# Patient Record
Sex: Female | Born: 1960 | Race: White | Hispanic: No | Marital: Single | Smoking: Never smoker
Health system: Southern US, Community
[De-identification: ages and names within clinical notes are randomized; demographics above are authoritative.]

---

## 1997-12-30 ENCOUNTER — Other Ambulatory Visit: Admission: RE | Admit: 1997-12-30 | Discharge: 1997-12-30 | Payer: Self-pay | Admitting: Obstetrics and Gynecology

## 1999-03-24 ENCOUNTER — Other Ambulatory Visit: Admission: RE | Admit: 1999-03-24 | Discharge: 1999-03-24 | Payer: Self-pay | Admitting: Obstetrics and Gynecology

## 2000-04-19 ENCOUNTER — Encounter: Payer: Self-pay | Admitting: Obstetrics and Gynecology

## 2000-04-19 ENCOUNTER — Ambulatory Visit (HOSPITAL_COMMUNITY): Admission: RE | Admit: 2000-04-19 | Discharge: 2000-04-19 | Payer: Self-pay | Admitting: Obstetrics and Gynecology

## 2000-04-28 ENCOUNTER — Encounter: Payer: Self-pay | Admitting: Obstetrics and Gynecology

## 2000-04-28 ENCOUNTER — Encounter: Admission: RE | Admit: 2000-04-28 | Discharge: 2000-04-28 | Payer: Self-pay | Admitting: Obstetrics and Gynecology

## 2000-06-01 ENCOUNTER — Other Ambulatory Visit: Admission: RE | Admit: 2000-06-01 | Discharge: 2000-06-01 | Payer: Self-pay | Admitting: Obstetrics and Gynecology

## 2000-11-23 ENCOUNTER — Encounter: Payer: Self-pay | Admitting: Obstetrics and Gynecology

## 2000-11-23 ENCOUNTER — Encounter: Admission: RE | Admit: 2000-11-23 | Discharge: 2000-11-23 | Payer: Self-pay | Admitting: Obstetrics and Gynecology

## 2001-08-30 ENCOUNTER — Ambulatory Visit (HOSPITAL_COMMUNITY): Admission: RE | Admit: 2001-08-30 | Discharge: 2001-08-30 | Payer: Self-pay | Admitting: Obstetrics and Gynecology

## 2001-08-30 ENCOUNTER — Encounter: Payer: Self-pay | Admitting: Obstetrics and Gynecology

## 2001-09-05 ENCOUNTER — Other Ambulatory Visit: Admission: RE | Admit: 2001-09-05 | Discharge: 2001-09-05 | Payer: Self-pay | Admitting: Obstetrics and Gynecology

## 2001-12-05 ENCOUNTER — Other Ambulatory Visit: Admission: RE | Admit: 2001-12-05 | Discharge: 2001-12-05 | Payer: Self-pay | Admitting: Obstetrics and Gynecology

## 2002-04-18 ENCOUNTER — Other Ambulatory Visit: Admission: RE | Admit: 2002-04-18 | Discharge: 2002-04-18 | Payer: Self-pay

## 2002-09-25 ENCOUNTER — Ambulatory Visit (HOSPITAL_COMMUNITY): Admission: RE | Admit: 2002-09-25 | Discharge: 2002-09-25 | Payer: Self-pay | Admitting: Obstetrics and Gynecology

## 2002-09-25 ENCOUNTER — Encounter: Payer: Self-pay | Admitting: Obstetrics and Gynecology

## 2003-10-15 ENCOUNTER — Ambulatory Visit (HOSPITAL_COMMUNITY): Admission: RE | Admit: 2003-10-15 | Discharge: 2003-10-15 | Payer: Self-pay | Admitting: Obstetrics and Gynecology

## 2004-06-10 ENCOUNTER — Inpatient Hospital Stay (HOSPITAL_COMMUNITY): Admission: EM | Admit: 2004-06-10 | Discharge: 2004-06-14 | Payer: Self-pay | Admitting: Emergency Medicine

## 2004-11-02 ENCOUNTER — Encounter: Payer: Self-pay | Admitting: Obstetrics and Gynecology

## 2004-11-02 ENCOUNTER — Ambulatory Visit (HOSPITAL_COMMUNITY): Admission: RE | Admit: 2004-11-02 | Discharge: 2004-11-02 | Payer: Self-pay | Admitting: Obstetrics and Gynecology

## 2005-12-02 IMAGING — CR DG CHEST 2V
2 series · 2 of 2 positions shown · non-contrast
Comparison: none

CLINICAL DATA: Cough, fever, weakness.  Headache.
CHEST - 2 VIEW:
No prior exams available for comparison.
Mild cardiac prominence.
Mediastinal contours normal.
Extensive bilateral infiltrates in the mid and lower lungs bilaterally, likely representing pneumonia.  
No definite effusion or pneumothorax.  
Bones unremarkable.

[view not recorded (1 of 2)]
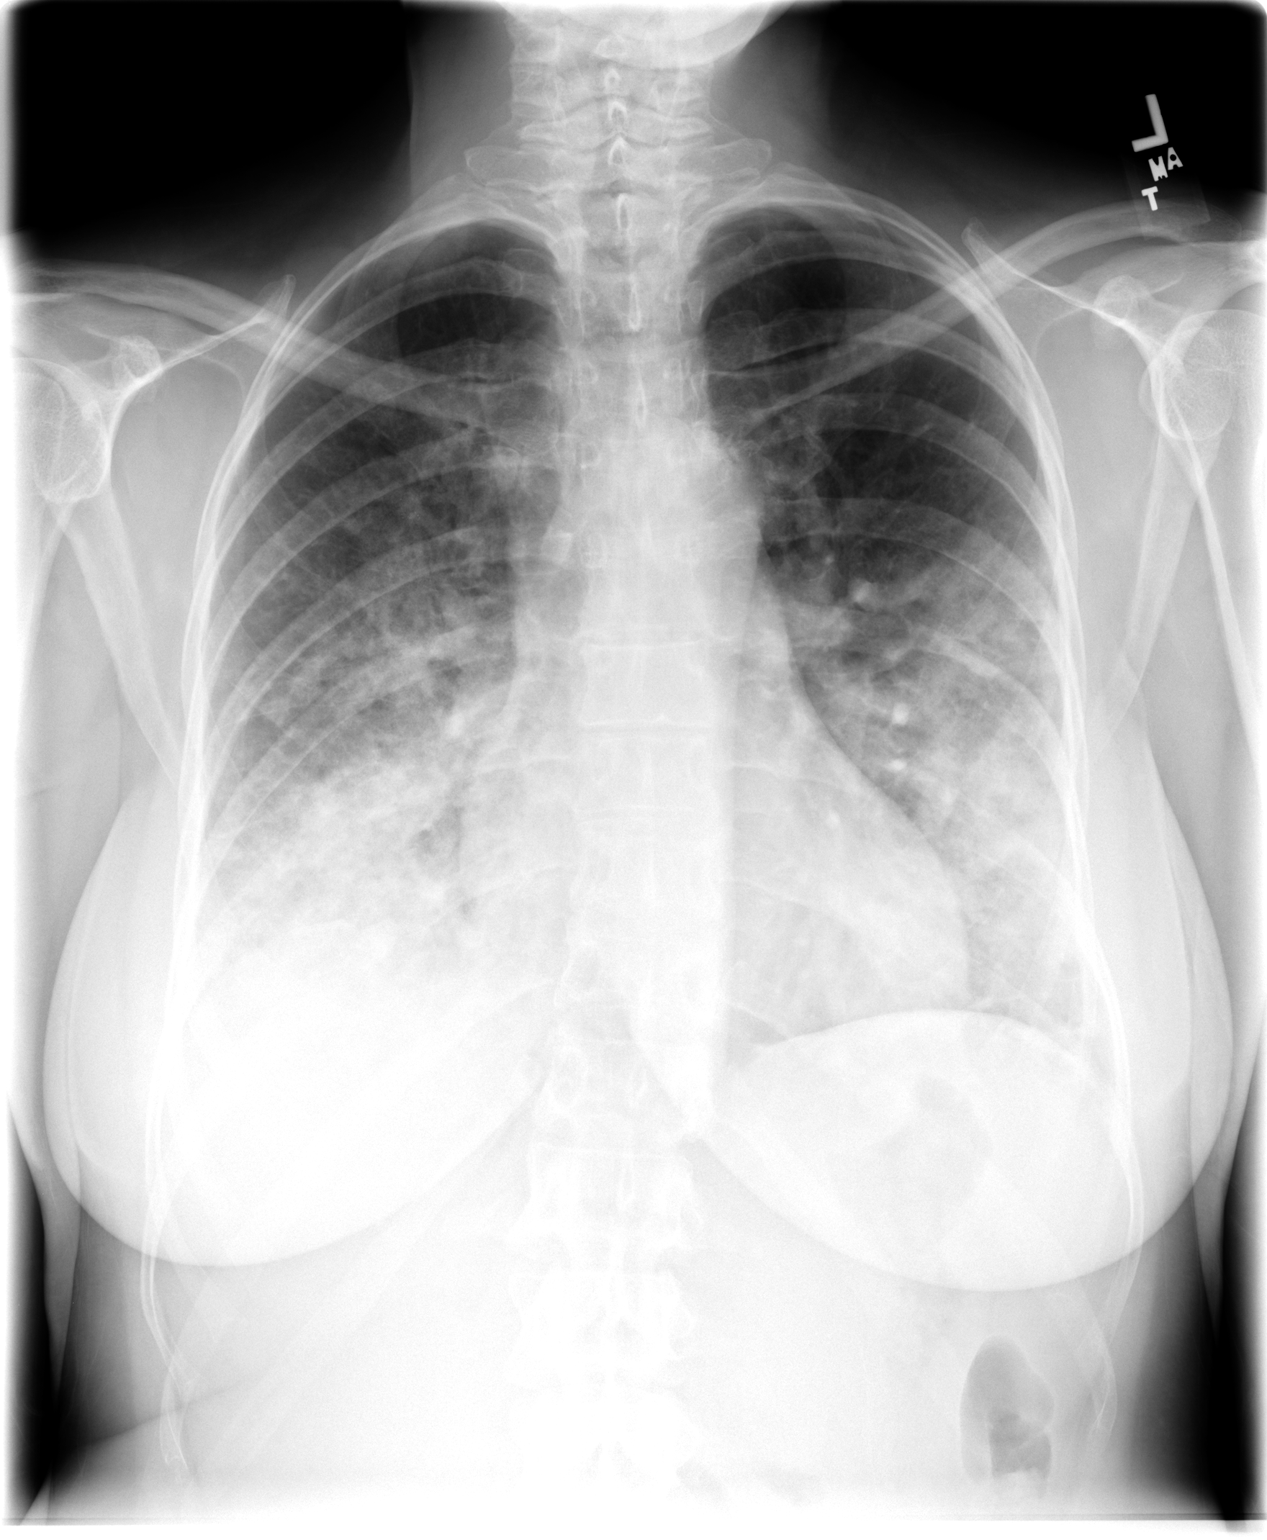

[view not recorded (2 of 2)]
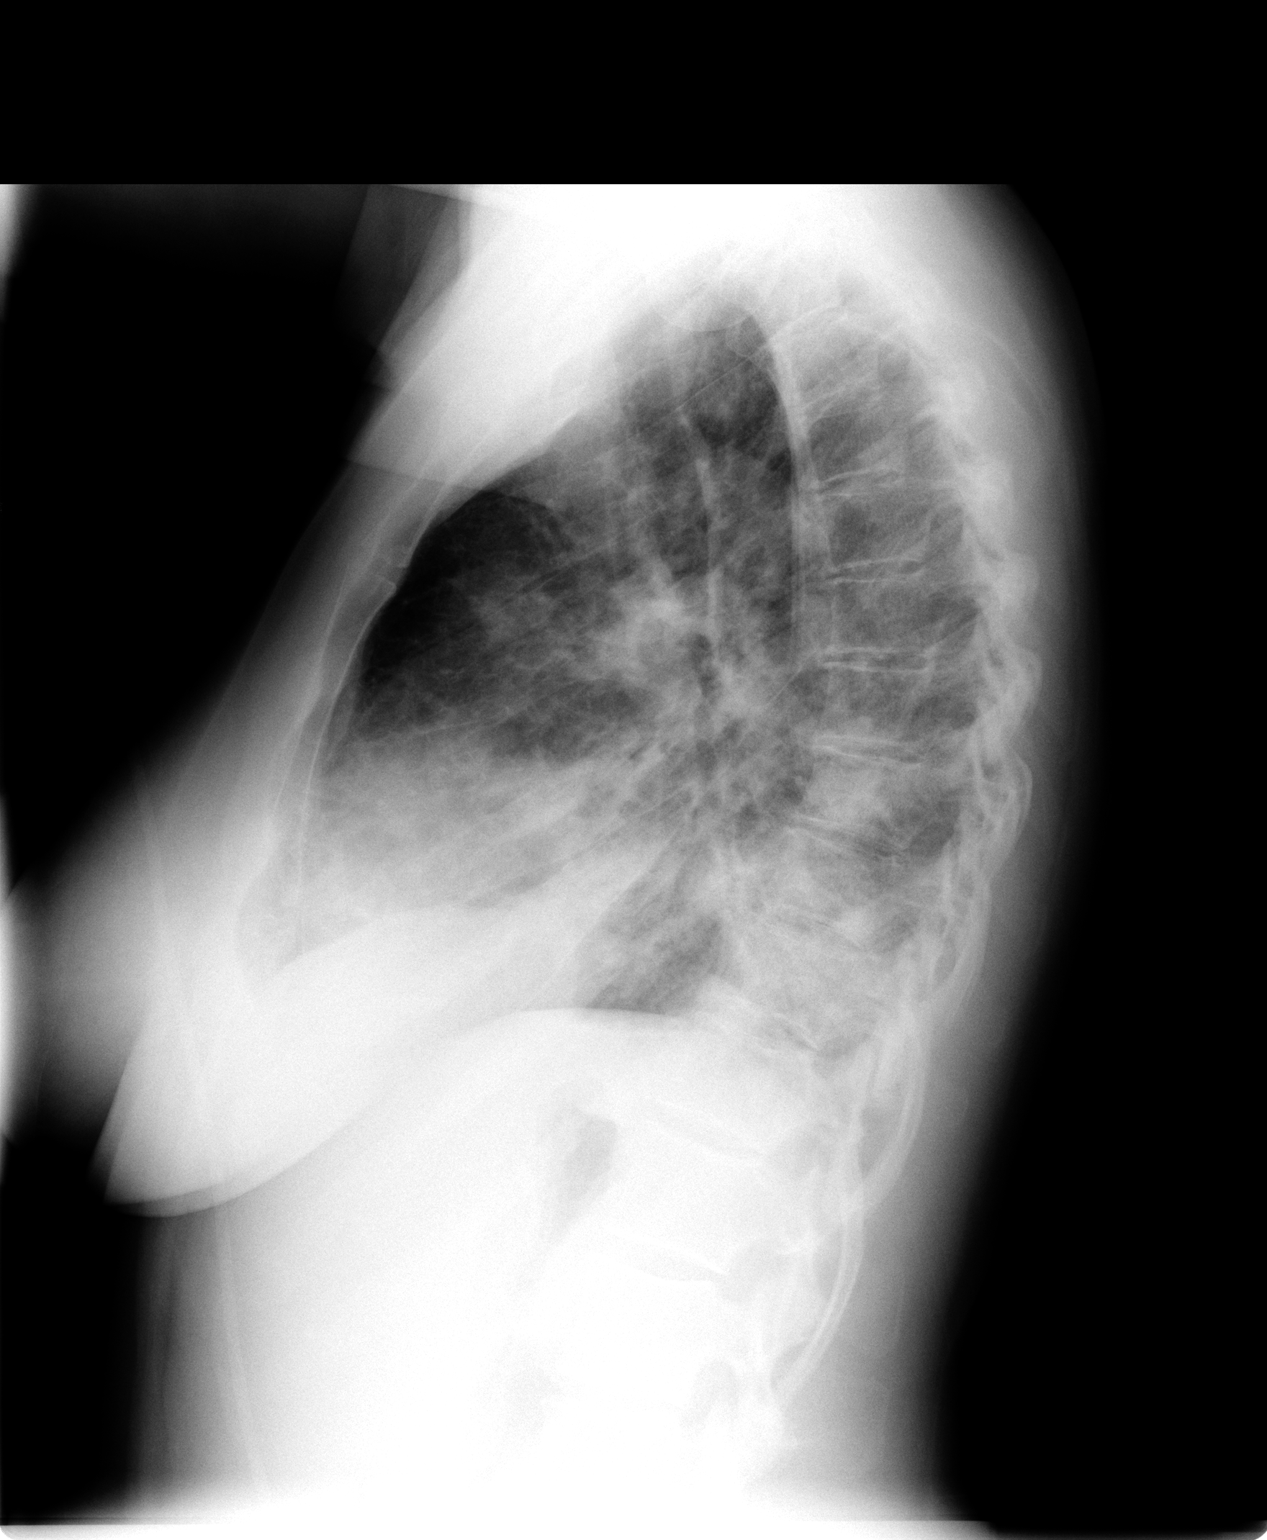

[2 of 2 positions shown; findings below may reference images not displayed]

IMPRESSION: Extensive bibasilar pulmonary infiltrates consistent with pneumonia.

## 2006-02-16 ENCOUNTER — Ambulatory Visit (HOSPITAL_COMMUNITY): Admission: RE | Admit: 2006-02-16 | Discharge: 2006-02-16 | Payer: Self-pay | Admitting: Obstetrics and Gynecology

## 2006-03-01 ENCOUNTER — Encounter: Admission: RE | Admit: 2006-03-01 | Discharge: 2006-03-01 | Payer: Self-pay | Admitting: Obstetrics and Gynecology

## 2010-08-15 ENCOUNTER — Encounter: Payer: Self-pay | Admitting: Obstetrics and Gynecology

## 2011-10-20 ENCOUNTER — Other Ambulatory Visit (HOSPITAL_COMMUNITY): Payer: Self-pay | Admitting: Obstetrics and Gynecology

## 2011-10-20 DIAGNOSIS — Z1231 Encounter for screening mammogram for malignant neoplasm of breast: Secondary | ICD-10-CM

## 2011-10-21 ENCOUNTER — Other Ambulatory Visit: Payer: Self-pay | Admitting: Obstetrics and Gynecology

## 2011-10-21 DIAGNOSIS — Z1231 Encounter for screening mammogram for malignant neoplasm of breast: Secondary | ICD-10-CM

## 2011-10-21 DIAGNOSIS — R921 Mammographic calcification found on diagnostic imaging of breast: Secondary | ICD-10-CM

## 2011-11-01 ENCOUNTER — Ambulatory Visit
Admission: RE | Admit: 2011-11-01 | Discharge: 2011-11-01 | Disposition: A | Discharge status: Home / Self Care | Payer: BC Managed Care – PPO | Source: Ambulatory Visit | Attending: Obstetrics and Gynecology | Admitting: Obstetrics and Gynecology

## 2011-11-01 DIAGNOSIS — R921 Mammographic calcification found on diagnostic imaging of breast: Secondary | ICD-10-CM

## 2011-11-17 ENCOUNTER — Ambulatory Visit (HOSPITAL_COMMUNITY): Payer: Self-pay

## 2012-11-29 ENCOUNTER — Other Ambulatory Visit: Payer: Self-pay

## 2012-11-29 DIAGNOSIS — Z1231 Encounter for screening mammogram for malignant neoplasm of breast: Secondary | ICD-10-CM

## 2013-01-02 ENCOUNTER — Ambulatory Visit
Admission: RE | Admit: 2013-01-02 | Discharge: 2013-01-02 | Disposition: A | Discharge status: Home / Self Care | Payer: BC Managed Care – PPO | Source: Ambulatory Visit

## 2013-01-02 DIAGNOSIS — Z1231 Encounter for screening mammogram for malignant neoplasm of breast: Secondary | ICD-10-CM

## 2014-12-23 ENCOUNTER — Encounter (HOSPITAL_COMMUNITY): Payer: Self-pay | Admitting: Emergency Medicine

## 2014-12-23 ENCOUNTER — Emergency Department (HOSPITAL_COMMUNITY)
Admission: EM | Admit: 2014-12-23 | Discharge: 2014-12-23 | Disposition: A | Discharge status: Home / Self Care | Payer: BLUE CROSS/BLUE SHIELD | Attending: Emergency Medicine | Admitting: Emergency Medicine

## 2014-12-23 DIAGNOSIS — Z79899 Other long term (current) drug therapy: Secondary | ICD-10-CM | POA: Insufficient documentation

## 2014-12-23 DIAGNOSIS — Z880 Allergy status to penicillin: Secondary | ICD-10-CM | POA: Insufficient documentation

## 2014-12-23 DIAGNOSIS — L259 Unspecified contact dermatitis, unspecified cause: Secondary | ICD-10-CM

## 2014-12-23 DIAGNOSIS — R21 Rash and other nonspecific skin eruption: Secondary | ICD-10-CM | POA: Diagnosis present

## 2014-12-23 MED ORDER — HYDROXYZINE HCL 25 MG PO TABS: 25.0000 mg | ORAL_TABLET | Freq: Four times a day (QID) | ORAL | Status: DC | PRN

## 2014-12-23 MED ORDER — PREDNISONE 50 MG PO TABS
60.0000 mg | ORAL_TABLET | Freq: Once | ORAL | Status: AC
  Administered 2014-12-23: 60 mg via ORAL
  Filled 2014-12-23 (×2): qty 1

## 2014-12-23 MED ORDER — PREDNISONE 10 MG PO TABS: 60.0000 mg | ORAL_TABLET | Freq: Every day | ORAL | Status: DC

## 2014-12-23 NOTE — ED Provider Notes (Signed)
TIME SEEN: 4:55 AM  CHIEF COMPLAINT: Rash  HPI: Pt is a 54 y.o. female who presents to the emergency department with diffuse pruritic rash that started yesterday. States that she thinks it is from a new laundry detergent. No lip or tongue swelling. No difficulty talking, swallowing, breathing. No wheezing. States she has been taking Benadryl at home with some relief. No other new soaps, lotions, medications, foods or animal exposures. Denies any tick bite. No fever. No rash on her palms or soles.  ROS: See HPI Constitutional: no fever  Eyes: no drainage  ENT: no runny nose   Cardiovascular:  no chest pain  Resp: no SOB  GI: no vomiting GU: no dysuria Integumentary: no rash  Allergy: no hives  Musculoskeletal: no leg swelling  Neurological: no slurred speech ROS otherwise negative  PAST MEDICAL HISTORY/PAST SURGICAL HISTORY:  History reviewed. No pertinent past medical history.  MEDICATIONS:  Prior to Admission medications   Medication Sig Start Date End Date Taking? Authorizing Provider  Multiple Vitamin (MULTIVITAMIN) tablet Take 1 tablet by mouth daily.   Yes Historical Provider, MD    ALLERGIES:  Allergies  Allergen Reactions  . Fish Allergy   . Penicillins   . Strawberry     SOCIAL HISTORY:  History  Substance Use Topics  . Smoking status: Never Smoker   . Smokeless tobacco: Not on file  . Alcohol Use: Yes    FAMILY HISTORY: History reviewed. No pertinent family history.  EXAM: BP 133/75 mmHg  Pulse 92  Temp(Src) 98.4 F (36.9 C) (Oral)  Resp 16  Ht 5\' 6"  (1.676 m)  Wt 185 lb (83.915 kg)  BMI 29.87 kg/m2  SpO2 96% CONSTITUTIONAL: Alert and oriented and responds appropriately to questions. Well-appearing; well-nourished HEAD: Normocephalic EYES: Conjunctivae clear, PERRL ENT: normal nose; no rhinorrhea; moist mucous membranes; pharynx without lesions noted NECK: Supple, no meningismus, no LAD  CARD: RRR; S1 and S2 appreciated; no murmurs, no clicks,  no rubs, no gallops RESP: Normal chest excursion without splinting or tachypnea; breath sounds clear and equal bilaterally; no wheezes, no rhonchi, no rales, no hypoxia or respiratory distress, speaking full sentences ABD/GI: Normal bowel sounds; non-distended; soft, non-tender, no rebound, no guarding, no peritoneal signs BACK:  The back appears normal and is non-tender to palpation, there is no CVA tenderness EXT: Normal ROM in all joints; non-tender to palpation; no edema; normal capillary refill; no cyanosis, no calf tenderness or swelling    SKIN: Normal color for age and race; warm; erythematous macular diffuse lesions that are pruritic to her torso and extremities, no lesions on the palms or soles, no blisters or desquamation, no hives, or lesions of the mucous membranes NEURO: Moves all extremities equally, sensation to light touch intact diffusely, cranial nerves II through XII intact PSYCH: The patient's mood and manner are appropriate. Grooming and personal hygiene are appropriate.  MEDICAL DECISION MAKING: Patient here with contact dermatitis likely from switching laundry detergent. We'll discharge with instructions to use Benadryl or Vistaril as needed for itching. Will also discharge on steroid taper. Discussed return precautions. She verbalized understanding and is comfortable with plan.       Thompson, DO 12/23/14 [phone]

## 2014-12-23 NOTE — Discharge Instructions (Signed)

## 2014-12-23 NOTE — ED Notes (Signed)
Pt c/o rash that started 5/30

## 2015-10-19 ENCOUNTER — Encounter: Payer: Self-pay | Admitting: Family Medicine

## 2015-10-19 ENCOUNTER — Ambulatory Visit (INDEPENDENT_AMBULATORY_CARE_PROVIDER_SITE_OTHER): Payer: BLUE CROSS/BLUE SHIELD | Admitting: Family Medicine

## 2015-10-19 VITALS — BP 142/88 | Temp 99.9°F | Ht 65.0 in | Wt 183.0 lb

## 2015-10-19 DIAGNOSIS — J019 Acute sinusitis, unspecified: Secondary | ICD-10-CM | POA: Diagnosis not present

## 2015-10-19 DIAGNOSIS — J111 Influenza due to unidentified influenza virus with other respiratory manifestations: Secondary | ICD-10-CM

## 2015-10-19 MED ORDER — AZITHROMYCIN 250 MG PO TABS: ORAL_TABLET | ORAL | Status: DC

## 2015-10-19 NOTE — Patient Instructions (Signed)

## 2015-10-19 NOTE — Progress Notes (Signed)
   Subjective:    Patient ID: Grace Price, female    DOB: May 30, 1961, 55 y.o.   MRN: [id number]  Cough This is a new problem. Episode onset: 2 weeks ago. Associated symptoms include nasal congestion and rhinorrhea. Pertinent negatives include no chest pain, ear pain, fever, shortness of breath or wheezing. Associated symptoms comments: Vomiting . Treatments tried: zpack, prednisone.   The patient has a history of pneumonia. She was concerned that this could go into this She states she had upper respiratory illness for about a week and a half treated with  z pack as well as prednisone but now she relates that she got better for about for 5 days then last night into this morning had body aches fever chills nausea vomiting runny nose and cough the vomiting is gone away but now having mainly Some body aches cough fatigue no wheezing or difficulty breathing  Review of Systems  Constitutional: Negative for fever and activity change.  HENT: Positive for congestion and rhinorrhea. Negative for ear pain.   Eyes: Negative for discharge.  Respiratory: Positive for cough. Negative for shortness of breath and wheezing.   Cardiovascular: Negative for chest pain.       Objective:   Physical Exam  Constitutional: She appears well-developed.  HENT:  Head: Normocephalic.  Nose: Nose normal.  Mouth/Throat: Oropharynx is clear and moist. No oropharyngeal exudate.  Neck: Neck supple.  Cardiovascular: Normal rate and normal heart sounds.   No murmur heard. Pulmonary/Chest: Effort normal and breath sounds normal. She has no wheezes.  Lymphadenopathy:    She has no cervical adenopathy.  Skin: Skin is warm and dry.  Nursing note and vitals reviewed.         Assessment & Plan:  Influenza-the patient was diagnosed with influenza. Patient/family educated about the flu and warning signs to watch for. If difficulty breathing, severe neck pain and stiffness, cyanosis, disorientation, or progressive  worsening then immediately get rechecked at that ER. If progressive symptoms be certain to be rechecked. Supportive measures such as Tylenol/ibuprofen was discussed. No aspirin use in children. And influenza home care instruction sheet was given. The patient is having some underlying bronchial issues she may get antibiotic filled if she gets progressive symptoms.

## 2016-08-15 DIAGNOSIS — Z6831 Body mass index (BMI) 31.0-31.9, adult: Secondary | ICD-10-CM | POA: Diagnosis not present

## 2016-08-15 DIAGNOSIS — Z01419 Encounter for gynecological examination (general) (routine) without abnormal findings: Secondary | ICD-10-CM | POA: Diagnosis not present

## 2016-08-15 DIAGNOSIS — Z1151 Encounter for screening for human papillomavirus (HPV): Secondary | ICD-10-CM | POA: Diagnosis not present

## 2016-08-15 DIAGNOSIS — Z1231 Encounter for screening mammogram for malignant neoplasm of breast: Secondary | ICD-10-CM | POA: Diagnosis not present

## 2017-12-05 DIAGNOSIS — Z01419 Encounter for gynecological examination (general) (routine) without abnormal findings: Secondary | ICD-10-CM | POA: Diagnosis not present

## 2017-12-05 DIAGNOSIS — Z1151 Encounter for screening for human papillomavirus (HPV): Secondary | ICD-10-CM | POA: Diagnosis not present

## 2017-12-05 DIAGNOSIS — Z6832 Body mass index (BMI) 32.0-32.9, adult: Secondary | ICD-10-CM | POA: Diagnosis not present

## 2017-12-05 DIAGNOSIS — Z1231 Encounter for screening mammogram for malignant neoplasm of breast: Secondary | ICD-10-CM | POA: Diagnosis not present

## 2018-01-01 DIAGNOSIS — Z1211 Encounter for screening for malignant neoplasm of colon: Secondary | ICD-10-CM | POA: Diagnosis not present

## 2018-01-01 DIAGNOSIS — Z1212 Encounter for screening for malignant neoplasm of rectum: Secondary | ICD-10-CM | POA: Diagnosis not present

## 2019-05-02 DIAGNOSIS — Z6830 Body mass index (BMI) 30.0-30.9, adult: Secondary | ICD-10-CM | POA: Diagnosis not present

## 2019-05-02 DIAGNOSIS — Z01419 Encounter for gynecological examination (general) (routine) without abnormal findings: Secondary | ICD-10-CM | POA: Diagnosis not present

## 2019-05-02 DIAGNOSIS — Z1151 Encounter for screening for human papillomavirus (HPV): Secondary | ICD-10-CM | POA: Diagnosis not present

## 2019-05-02 DIAGNOSIS — Z1231 Encounter for screening mammogram for malignant neoplasm of breast: Secondary | ICD-10-CM | POA: Diagnosis not present

## 2019-08-28 ENCOUNTER — Encounter: Payer: Self-pay | Admitting: Family Medicine

## 2020-12-05 LAB — COLOGUARD: COLOGUARD: NEGATIVE

## 2020-12-05 LAB — EXTERNAL GENERIC LAB PROCEDURE: COLOGUARD: NEGATIVE

## 2021-02-04 ENCOUNTER — Ambulatory Visit: Payer: 59

## 2021-02-04 ENCOUNTER — Ambulatory Visit: Payer: 59 | Admitting: Orthopedic Surgery

## 2021-02-04 ENCOUNTER — Encounter: Payer: Self-pay | Admitting: Orthopedic Surgery

## 2021-02-04 ENCOUNTER — Other Ambulatory Visit: Payer: Self-pay

## 2021-02-04 VITALS — BP 148/79 | HR 65 | Ht 65.0 in | Wt 180.4 lb

## 2021-02-04 DIAGNOSIS — M170 Bilateral primary osteoarthritis of knee: Secondary | ICD-10-CM | POA: Diagnosis not present

## 2021-02-04 DIAGNOSIS — G8929 Other chronic pain: Secondary | ICD-10-CM

## 2021-02-04 DIAGNOSIS — M25562 Pain in left knee: Secondary | ICD-10-CM

## 2021-02-04 DIAGNOSIS — M25561 Pain in right knee: Secondary | ICD-10-CM

## 2021-02-04 MED ORDER — TRAMADOL HCL 50 MG PO TABS: 50.0000 mg | ORAL_TABLET | Freq: Four times a day (QID) | ORAL | 0 refills | Status: DC | PRN

## 2021-02-04 MED ORDER — DICLOFENAC POTASSIUM 50 MG PO TABS: 50.0000 mg | ORAL_TABLET | Freq: Two times a day (BID) | ORAL | 3 refills | Status: DC

## 2021-02-04 NOTE — Progress Notes (Signed)
NEW PROBLEM//OFFICE VISIT  Summary assessment and plan:   59 bilateral knee pain otherwise healthy not managed medically  Recommend diclofenac twice a day and 50 mg of tramadol at the patient's request for bad days  No orders of the defined types were placed in this encounter.   Patient education  Follow-up as needed no surgery needed at this time  Chief Complaint  Patient presents with   Knee Pain    Bilateral L>R   59 year old female bilateral knee pain presents for evaluation and management.  She says her symptoms actually started when she played sports as a child she always had crepitance in her knees when they went into flexion.  She does say that this lasted until she was about 28.  She had a traumatic flexion injury to the left knee with a bone chip and has had a difficulty with that knee since that time  She has intermittent severe pain in both knees with stiffness often bringing her to tears this is not every day.  She says the pain is in the front of the knee both on the right and the left.  She did try 100 mg of Motrin twice a day for pain but on some days this does not help    MEDICAL DECISION MAKING  A.  Encounter Diagnoses  Name Primary?   Chronic pain of left knee Yes   Chronic pain of right knee     B. DATA ANALYSED:   IMAGING: Interpretation of images: Internal images of both knees see report both knees are in slight varus, both knees have moderate joint space narrowing and mild secondary bone changes  Orders: no  Outside records reviewed: no   C. MANAGEMENT   Non-op rx  No orders of the defined types were placed in this encounter.    BP (!) 148/79   Pulse 65   Ht 5\' 5"  (1.651 m)   Wt 180 lb 6.4 oz (81.8 kg)   BMI 30.02 kg/m    General appearance: Well-developed well-nourished no gross deformities  Cardiovascular normal pulse and perfusion normal color without edema  Neurologically no sensation loss or deficits or pathologic  reflexes  Psychological: Awake alert and oriented x3 mood and affect normal  Skin no lacerations or ulcerations no nodularity no palpable masses, no erythema or nodularity  Musculoskeletal: The left knee has a slightly greater flexion contracture than the right she is nontender today but the knees look puffy the range of motion is approximately 110 on the left and 115 on the right without instability on either side   Review of Systems  All other systems reviewed and are negative.   No past medical history on file.  No past surgical history on file.  No family history on file. Social History   Tobacco Use   Smoking status: Never   Smokeless tobacco: Never  Substance Use Topics   Alcohol use: Yes   Drug use: No    Allergies  Allergen Reactions   Fish Allergy    Penicillins    Strawberry Extract    Sulfa Antibiotics Hives    Current Meds  Medication Sig   Calcium Carbonate (CALCIUM 500 PO) Take 500 mg by mouth daily.   Cetirizine HCl (ZYRTEC PO) Take by mouth.   Cholecalciferol 125 MCG (5000 UT) TABS Take 5,000 Units by mouth daily.   ibuprofen (ADVIL) 200 MG tablet Take 800 mg by mouth in the morning and at bedtime.   Multiple Vitamin (MULTIVITAMIN) tablet  Take 1 tablet by mouth daily.   pantoprazole (PROTONIX) 40 MG tablet Take 40 mg by mouth daily.   sertraline (ZOLOFT) 100 MG tablet Take 100 mg by mouth daily.        Arther Abbott, MD  02/04/2021 11:39 AM

## 2021-04-28 ENCOUNTER — Telehealth: Payer: Self-pay | Admitting: Radiology

## 2021-04-28 MED ORDER — DICLOFENAC POTASSIUM 50 MG PO TABS: 50.0000 mg | ORAL_TABLET | Freq: Two times a day (BID) | ORAL | 1 refills | Status: DC

## 2021-04-28 NOTE — Addendum Note (Signed)
Addended byCandice Camp on: 04/28/2021 01:10 PM   Modules accepted: Orders

## 2021-04-28 NOTE — Telephone Encounter (Signed)
Pharmacy faxed stating that insurance requires a 90 day Rx for the diclofenac potassium 50 mg tablets.  Please send Rx in accordingly.

## 2021-04-28 NOTE — Telephone Encounter (Signed)
Resent

## 2021-08-12 ENCOUNTER — Other Ambulatory Visit: Payer: Self-pay | Admitting: Orthopedic Surgery

## 2021-08-17 ENCOUNTER — Other Ambulatory Visit: Payer: Self-pay | Admitting: Orthopedic Surgery

## 2021-08-18 ENCOUNTER — Other Ambulatory Visit: Payer: Self-pay | Admitting: Orthopedic Surgery

## 2021-08-19 NOTE — Telephone Encounter (Signed)
Last seen July needs appt

## 2021-08-19 NOTE — Telephone Encounter (Signed)
Please call Janett Billow at [facility] [place]- or send in the Rx if applicable.  They received a denial for tramadol, said sent in by other means.  I see this one pending.  Thanks.

## 2021-08-23 ENCOUNTER — Ambulatory Visit: Payer: 59 | Admitting: Orthopedic Surgery

## 2021-08-23 ENCOUNTER — Telehealth: Payer: Self-pay | Admitting: Orthopedic Surgery

## 2021-08-23 NOTE — Telephone Encounter (Signed)
Patient request a refill on her Tramadol 50 mg.  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 If possible please refill till she can come in the office.   Pharmacy:  [facility] on [address]   Any questions please call her back at [phone]

## 2021-08-23 NOTE — Telephone Encounter (Signed)
To Dr Aline Brochure, I called her, but he said this is a new problem, so routing to him

## 2021-08-23 NOTE — Telephone Encounter (Signed)
Dr Aline Brochure states he can not refill / has been too long since she was in the office. I called her to advise.

## 2021-08-24 ENCOUNTER — Other Ambulatory Visit: Payer: Self-pay | Admitting: Orthopedic Surgery

## 2021-08-24 DIAGNOSIS — M25561 Pain in right knee: Secondary | ICD-10-CM

## 2021-08-24 DIAGNOSIS — G8929 Other chronic pain: Secondary | ICD-10-CM

## 2021-08-24 MED ORDER — TRAMADOL HCL 50 MG PO TABS: 50.0000 mg | ORAL_TABLET | Freq: Four times a day (QID) | ORAL | 0 refills | Status: AC | PRN

## 2021-08-24 NOTE — Progress Notes (Signed)
Meds ordered this encounter  Medications   traMADol (ULTRAM) 50 MG tablet    Sig: Take 1 tablet (50 mg total) by mouth every 6 (six) hours as needed for up to 5 days.    Dispense:  20 tablet    Refill:  0    

## 2021-09-09 ENCOUNTER — Ambulatory Visit: Payer: 59 | Admitting: Orthopedic Surgery

## 2021-09-09 ENCOUNTER — Encounter: Payer: Self-pay | Admitting: Orthopedic Surgery

## 2021-09-09 ENCOUNTER — Other Ambulatory Visit: Payer: Self-pay

## 2021-09-09 DIAGNOSIS — M170 Bilateral primary osteoarthritis of knee: Secondary | ICD-10-CM | POA: Diagnosis not present

## 2021-09-09 DIAGNOSIS — M25562 Pain in left knee: Secondary | ICD-10-CM

## 2021-09-09 DIAGNOSIS — D171 Benign lipomatous neoplasm of skin and subcutaneous tissue of trunk: Secondary | ICD-10-CM | POA: Insufficient documentation

## 2021-09-09 DIAGNOSIS — G8929 Other chronic pain: Secondary | ICD-10-CM

## 2021-09-09 DIAGNOSIS — L821 Other seborrheic keratosis: Secondary | ICD-10-CM | POA: Insufficient documentation

## 2021-09-09 DIAGNOSIS — D0360 Melanoma in situ of unspecified upper limb, including shoulder: Secondary | ICD-10-CM | POA: Insufficient documentation

## 2021-09-09 MED ORDER — TRAMADOL HCL 50 MG PO TABS: 50.0000 mg | ORAL_TABLET | Freq: Four times a day (QID) | ORAL | 5 refills | Status: DC | PRN

## 2021-09-09 MED ORDER — DICLOFENAC POTASSIUM 50 MG PO TABS: 50.0000 mg | ORAL_TABLET | Freq: Two times a day (BID) | ORAL | 1 refills | Status: DC

## 2021-09-09 NOTE — Patient Instructions (Signed)
Ice 30 min when needed  Daily diclofenac  Tramadol as needed

## 2021-09-09 NOTE — Progress Notes (Signed)
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  She is on diclofenac twice a day takes Tylenol intermittently seems to have difficulty at work on certain days when the activity level and work requirements increase.  When this happens she has inferior anterior knee pain at the patellar tendon insertion and this causes her to hobble quite a bit and it usually goes away with some rest  She says she uses a tramadol on an as-needed basis and the last prescription lasted her a year  Exam today shows a stable left knee without effusion there is no tenderness or swelling in the joint or the patella tendon strength is normal and the knee is stable  Discussing this with her I think we can manage her with continued diclofenac twice a day tramadol on as-needed basis on days where she is having severe pain she should ice the knee after hard days  She can continue Tylenol intermittently   Meds ordered this encounter  Medications   traMADol (ULTRAM) 50 MG tablet    Sig: Take 1 tablet (50 mg total) by mouth every 6 (six) hours as needed.    Dispense:  60 tablet    Refill:  5   diclofenac (CATAFLAM) 50 MG tablet    Sig: Take 1 tablet (50 mg total) by mouth 2 (two) times daily.    Dispense:  180 tablet    Refill:  1   Encounter Diagnoses  Name Primary?   Chronic pain of left knee Yes   Arthritis of both knees     Her chronic problem right now is stable, prescription management leading to an Level 3 visit

## 2022-03-10 ENCOUNTER — Other Ambulatory Visit: Payer: Self-pay | Admitting: Orthopedic Surgery

## 2022-03-15 ENCOUNTER — Other Ambulatory Visit: Payer: Self-pay | Admitting: Orthopedic Surgery

## 2022-05-18 ENCOUNTER — Other Ambulatory Visit: Payer: Self-pay | Admitting: Orthopedic Surgery

## 2022-08-22 ENCOUNTER — Other Ambulatory Visit: Payer: Self-pay | Admitting: Orthopedic Surgery

## 2022-08-25 ENCOUNTER — Other Ambulatory Visit: Payer: Self-pay | Admitting: Orthopedic Surgery

## 2022-10-19 ENCOUNTER — Other Ambulatory Visit: Payer: Self-pay | Admitting: Dermatology

## 2022-10-19 DIAGNOSIS — D485 Neoplasm of uncertain behavior of skin: Secondary | ICD-10-CM

## 2022-10-26 ENCOUNTER — Ambulatory Visit
Admission: RE | Admit: 2022-10-26 | Discharge: 2022-10-26 | Disposition: A | Discharge status: Home / Self Care | Payer: 59 | Source: Ambulatory Visit | Attending: Dermatology | Admitting: Dermatology

## 2022-10-26 DIAGNOSIS — D485 Neoplasm of uncertain behavior of skin: Secondary | ICD-10-CM

## 2023-01-09 ENCOUNTER — Other Ambulatory Visit: Payer: Self-pay | Admitting: Orthopedic Surgery

## 2023-01-31 ENCOUNTER — Other Ambulatory Visit: Payer: Self-pay | Admitting: Orthopedic Surgery

## 2023-04-25 ENCOUNTER — Other Ambulatory Visit: Payer: Self-pay | Admitting: Orthopedic Surgery

## 2023-05-22 ENCOUNTER — Encounter: Payer: 59 | Admitting: Orthopedic Surgery

## 2023-05-25 ENCOUNTER — Encounter: Payer: Self-pay | Admitting: Orthopedic Surgery

## 2023-05-25 ENCOUNTER — Ambulatory Visit: Payer: 59 | Admitting: Orthopedic Surgery

## 2023-05-25 ENCOUNTER — Other Ambulatory Visit (INDEPENDENT_AMBULATORY_CARE_PROVIDER_SITE_OTHER): Payer: 59

## 2023-05-25 ENCOUNTER — Telehealth: Payer: Self-pay | Admitting: Orthopedic Surgery

## 2023-05-25 ENCOUNTER — Other Ambulatory Visit: Payer: Self-pay | Admitting: Orthopedic Surgery

## 2023-05-25 VITALS — BP 152/88 | HR 73 | Ht 65.0 in | Wt 170.0 lb

## 2023-05-25 DIAGNOSIS — M25571 Pain in right ankle and joints of right foot: Secondary | ICD-10-CM

## 2023-05-25 DIAGNOSIS — M76821 Posterior tibial tendinitis, right leg: Secondary | ICD-10-CM

## 2023-05-25 DIAGNOSIS — M19071 Primary osteoarthritis, right ankle and foot: Secondary | ICD-10-CM

## 2023-05-25 DIAGNOSIS — M7731 Calcaneal spur, right foot: Secondary | ICD-10-CM

## 2023-05-25 DIAGNOSIS — M170 Bilateral primary osteoarthritis of knee: Secondary | ICD-10-CM

## 2023-05-25 MED ORDER — DICLOFENAC SODIUM 75 MG PO TBEC: 75.0000 mg | DELAYED_RELEASE_TABLET | Freq: Two times a day (BID) | ORAL | 2 refills | Status: DC

## 2023-05-25 NOTE — Progress Notes (Signed)
Office Visit Note   Patient: Grace Price           Date of Birth: Mar 24, 1961           MRN: [id number] Visit Date: 05/25/2023 Requested by: Annalee Genta, DO [address] [place],  [place] [address] PCP: Annalee Genta, DO   Assessment & Plan:   Encounter Diagnoses  Name Primary?   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  Recommend ASO bracing physical therapy if no improvement after 6 weeks recommend MRI   Subjective: Chief Complaint  Patient presents with   Ankle Pain    R ankle pain after missing a step 2 mos ago. Said step hit right in her arch and she rolled her ankle.     HPI: 62 year old female rolled her ankle 2 months ago and when she rolled at the step hit the medial side of her forefoot and instep and she complains of lateral pain no prior treatment              ROS: Knee pain from arthritis   Images personally read and my interpretation : Our imaging today shows midfoot arthritis plantar spur calcaneal spur posteriorly small osteophyte medial malleolus  Visit Diagnoses:  1. Insufficiency of right posterior tibial tendon   2. Pain in right ankle and joints of right foot      Follow-Up Instructions: Return in about 6 weeks (around 07/06/2023) for FOLLOW UP, ANKLE, FOOT.    Objective: Vital Signs: BP (!) 152/88   Pulse 73   Ht 5\' 5"  (1.651 m)   Wt 170 lb (77.1 kg)   BMI 28.29 kg/m   Physical Exam Vitals and nursing note reviewed.  Constitutional:      Appearance: Normal appearance.  HENT:     Head: Normocephalic and atraumatic.  Eyes:     General: No scleral icterus.       Right eye: No discharge.        Left eye: No discharge.     Extraocular Movements: Extraocular movements intact.     Conjunctiva/sclera:  Conjunctivae normal.     Pupils: Pupils are equal, round, and reactive to light.  Cardiovascular:     Rate and Rhythm: Normal rate.     Pulses: Normal pulses.  Skin:    General: Skin is warm and dry.     Capillary Refill: Capillary refill takes less than 2 seconds.  Neurological:     General: No focal deficit present.     Mental Status: She is alert and oriented to person, place, and time.     Gait: Gait normal.  Psychiatric:        Mood and Affect: Mood normal.        Behavior: Behavior normal.        Thought Content: Thought content normal.        Judgment: Judgment normal.      Right Ankle Exam   Tenderness  Right ankle tenderness location: Medial tenderness along the plantar arch posterior tibial tendon mild swelling.  Range of Motion  Dorsiflexion:  15  Plantar flexion:  5 abnormal  Eversion:  normal  Inversion:  abnormal   Muscle Strength  Posterior tibial:  3/5  Tests  Anterior drawer: negative  Other  Erythema: absent Scars: absent Sensation: normal Pulse: present        Specialty Comments:  No specialty comments available.  Imaging: No results found.   PMFS History: Patient Active Problem List   Diagnosis Date Noted   Lipoma of back 09/09/2021   Seborrheic keratosis 09/09/2021   Melanoma in situ of shoulder (HCC) 09/09/2021   No past medical history on file.  No family history on file.  No past surgical history on file. Social History   Occupational History   Not on file  Tobacco Use   Smoking status: Never   Smokeless tobacco: Never  Substance and Sexual Activity   Alcohol use: Yes   Drug use: No   Sexual activity: Not on file

## 2023-05-25 NOTE — Addendum Note (Signed)
Addended byCaffie Damme on: 05/25/2023 09:48 AM   Modules accepted: Orders

## 2023-05-25 NOTE — Telephone Encounter (Signed)
Dr. Mort Sawyers pt - spoke w/the pt, she stated she was just here and she forgot to request that she go up to 75mg  on the Diclofenac.  [facility] on [address].

## 2023-05-25 NOTE — Progress Notes (Signed)
Requested Prescriptions   Signed Prescriptions Disp Refills   diclofenac (VOLTAREN) 75 MG EC tablet 60 tablet 2    Sig: Take 1 tablet (75 mg total) by mouth 2 (two) times daily with a meal.

## 2023-05-31 NOTE — Therapy (Signed)
OUTPATIENT PHYSICAL THERAPY LOWER EXTREMITY EVALUATION   Patient Name: Grace Price MRN: [id number] DOB:January 28, 1961, 62 y.o., female Today's Date: 06/01/2023  END OF SESSION:  PT End of Session - 06/01/23 1104     Visit Number 1    Number of Visits 12    Date for PT Re-Evaluation 07/13/23    PT Start Time 1104    PT Stop Time 1144    PT Time Calculation (min) 40 min    Activity Tolerance Patient tolerated treatment well    Behavior During Therapy [facility] for tasks assessed/performed             History reviewed. No pertinent past medical history. History reviewed. No pertinent surgical history. Patient Active Problem List   Diagnosis Date Noted   Lipoma of back 09/09/2021   Seborrheic keratosis 09/09/2021   Melanoma in situ of shoulder (HCC) 09/09/2021    PCP: Annalee Genta, DO  REFERRING PROVIDER: Vickki Hearing, MD   REFERRING DIAG:  [phone] (ICD-10-CM) - Pain in right ankle and joints of right foot M76.821 (ICD-10-CM) - Insufficiency of right posterior tibial tendon  THERAPY DIAG:  Muscle weakness (generalized)  Stiffness of right ankle, not elsewhere classified  Unsteadiness on feet  Rationale for Evaluation and Treatment: Rehabilitation  ONSET DATE: 2 Mos Ago  SUBJECTIVE:   SUBJECTIVE STATEMENT: Patient reports missing a step 2 months ago the step contacted her medial arch and she "rolled" her ankle, notable swelling in the lateral ankle. Pt reports diffuse pain along the the anterolateral ankle. Patient reports imaging is normal and physician reported weakening of the ligaments. She reports pain with prolonged walking; she's currently using an ankle lace up brace which has mitigated the pain. Patient has born "pigeon-toed" and wore special braces for about a year.   PERTINENT HISTORY: Per MD note on 05/25/23, "62 year old female rolled her ankle 2 months ago and when she rolled at the step hit the medial side of her forefoot and instep and she  complains of lateral pain no prior treatment."  PAIN:  Are you having pain? No  PRECAUTIONS: None  RED FLAGS: None   WEIGHT BEARING RESTRICTIONS: No  FALLS:  Has patient fallen in last 6 months? Yes. 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  DIAGNOSTIC FINDINGS:   Imaging of R ankle negative for acute fx. Arthritis noted.   PATIENT SURVEYS:  LEFS to be assessed visit #2  COGNITION: Overall cognitive status: Within functional limits for tasks assessed     SENSATION: WFL  POSTURE: rounded shoulders, forward head, and increased thoracic kyphosis  PALPATION:   No pain with talocural distraction  Mild pain with 4th tarsal-metatarsal AP glide TTP on anterior joint line   LOWER EXTREMITY ROM:  Active ROM Right eval Left eval  Hip flexion    Hip extension    Hip abduction    Hip adduction    Hip internal rotation    Hip external rotation    Knee flexion    Knee extension    Ankle dorsiflexion 9 12  Ankle plantarflexion 6 10  Ankle inversion  Ankle eversion     (Blank rows = not tested)  LOWER EXTREMITY MMT:  MMT Right eval Left eval  Hip flexion    Hip extension    Hip abduction    Hip adduction    Hip internal rotation    Hip external rotation    Knee flexion    Knee extension    Ankle dorsiflexion 5 5  Ankle plantarflexion 4 5  Ankle inversion 4+ 4+  Ankle eversion 4+ 4+   (Blank rows = not tested)  LOWER EXTREMITY SPECIAL TESTS:  Ankle special tests: Anterior drawer test: positive on R  ; Posterior drawer test -  negative on R   -No pain with talocural distraction   FUNCTIONAL TESTS:  Single Leg Balance: Deferred  GAIT: Distance walked: 20' Assistive device utilized: None Level of assistance: Complete Independence Comments: Bilateral pronation of the foot, decreased knee extension, genu valgus    TODAY'S TREATMENT:                                                                                                                              DATE: 06/01/23    PATIENT EDUCATION:  Education details: Plan of Care and HEP  Person educated: Patient Education method: Explanation, Demonstration, and Handouts Education comprehension: verbalized understanding and returned demonstration  HOME EXERCISE PROGRAM: Access Code: [email or web address] URL: [email or web address] Date: 06/01/2023 Prepared by: Maylon Peppers  Exercises - Seated Toe Towel Scrunches  - 2-3 x daily - 5-7 x weekly - 2-3 sets - 10-12 reps - Seated Ankle Plantarflexion with Resistance  - 2-3 x daily - 5-7 x weekly - 2-3 sets - 10-12 reps - Gastroc Stretch on Wall  - 2-3 x daily - 5-7 x weekly - 10 reps - 10-30s hold - Seated Ankle Circles  - 2-3 x daily - 5-7 x weekly - 3 sets - 10 reps  ASSESSMENT:  CLINICAL IMPRESSION: Patient is a 62 y.o. female who was seen today for physical therapy evaluation and treatment for right ankle pain and difficulty walking. Objective finding significant for R ankle ROM deficits, muscle weakness, and abnormal gait. 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 Gait is also limited by R knee pain and LE weakness. HEP provided with focus on gastroc stretching, ankle and intrinsic foot musculature strengthening. Patient will benefit from skilled PT interventions to address listed impairments to improve gait quality and reduce pain.    OBJECTIVE IMPAIRMENTS: Abnormal gait, decreased ROM, decreased strength, and pain.   ACTIVITY LIMITATIONS:  stairs  PARTICIPATION LIMITATIONS: community activity and occupation  PERSONAL FACTORS: Age, Past/current experiences, Profession, and Time since onset of injury/illness/exacerbation are also affecting patient's functional outcome.   REHAB POTENTIAL: Good  CLINICAL DECISION MAKING: Evolving/moderate complexity  EVALUATION COMPLEXITY: Moderate   GOALS: Goals reviewed with patient? Yes  SHORT TERM GOALS: Target date: 06/21/2023  Patient will be independent in HEP to improve strength/mobility for better functional independence with  ADLs. Baseline: Goal status: INITIAL  LONG TERM GOALS: Target date: 07/12/2023  Patient will increase lower extremity functional scale (LEFS) to >60/80 to demonstrate improved functional mobility and increased tolerance with ADLs.  Baseline: 11/7: to be completed visit #2  Goal status: INITIAL  2.  Patient will increase BLE gross strength to 4+/5 as to improve functional strength for independent gait, increased standing tolerance and increased ADL ability. Baseline:  Goal status: INITIAL  3.  Patient will be able to maintain single leg stance >30 seconds with use of ankle strategy to improve intrinsic musculature strength in L and R ankle.  Baseline: 11/7: to be assessed at visit #2 Goal status: INITIAL  PLAN:  PT FREQUENCY: 1-2x/week  PT DURATION: 6 weeks  PLANNED INTERVENTIONS: 97110-Therapeutic exercises, 97530- Therapeutic activity, 97112- Neuromuscular re-education, 97535- Self Care, [id number]- Manual therapy, [phone]- Gait training, [phone]- Orthotic Fit/training, Patient/Family education, Balance training, Stair training, Joint mobilization, Joint manipulation, Cryotherapy, and Moist heat  PLAN FOR NEXT SESSION: LEFS, ankle and general LE strengthening, single leg stance on/off foam   Viviann Spare, PT, DPT  06/01/2023, 3:21 PM

## 2023-06-01 ENCOUNTER — Encounter (HOSPITAL_COMMUNITY): Payer: Self-pay

## 2023-06-01 ENCOUNTER — Ambulatory Visit (HOSPITAL_COMMUNITY): Payer: 59 | Attending: Orthopedic Surgery

## 2023-06-01 DIAGNOSIS — M25671 Stiffness of right ankle, not elsewhere classified: Secondary | ICD-10-CM | POA: Insufficient documentation

## 2023-06-01 DIAGNOSIS — M25571 Pain in right ankle and joints of right foot: Secondary | ICD-10-CM | POA: Insufficient documentation

## 2023-06-01 DIAGNOSIS — M6281 Muscle weakness (generalized): Secondary | ICD-10-CM | POA: Diagnosis present

## 2023-06-01 DIAGNOSIS — R2681 Unsteadiness on feet: Secondary | ICD-10-CM | POA: Insufficient documentation

## 2023-06-01 DIAGNOSIS — M76821 Posterior tibial tendinitis, right leg: Secondary | ICD-10-CM | POA: Diagnosis not present

## 2023-06-06 ENCOUNTER — Other Ambulatory Visit: Payer: Self-pay | Admitting: Orthopedic Surgery

## 2023-07-04 ENCOUNTER — Other Ambulatory Visit: Payer: Self-pay | Admitting: Orthopedic Surgery

## 2023-07-06 ENCOUNTER — Encounter: Payer: Self-pay | Admitting: Orthopedic Surgery

## 2023-07-06 ENCOUNTER — Ambulatory Visit: Payer: 59 | Admitting: Orthopedic Surgery

## 2023-07-06 DIAGNOSIS — M76821 Posterior tibial tendinitis, right leg: Secondary | ICD-10-CM

## 2023-07-06 NOTE — Patient Instructions (Signed)
Continue  Brace  Insert Shoe wear

## 2023-07-06 NOTE — Progress Notes (Signed)
  Office Visit Note   Patient: Grace Price           Date of Birth: 08-04-1960           MRN: [id number] Visit Date: 07/06/2023 Requested by: Annalee Genta, DO [address] [place],  [place] [address] PCP: No primary care provider on file.   Chief Complaint  Patient presents with   Foot Pain    Right/ PTTD    Allergies  Allergen Reactions   Fish Allergy    Penicillins    Strawberry Extract    Sulfa Antibiotics Hives   Encounter Diagnosis  Name Primary?   Insufficiency of right posterior tibial tendon Yes   There is no height or weight on file to calculate BMI.  Significant improvement with decreased pain and increased function primarily from performing physical therapy, obtaining the proper shoe wear and the proper insert.  She is also in an ASO brace  IMAGING: No results found.  MANAGEMENT   Current treatment emphasize exercises follow-up as needed  No orders of the defined types were placed in this encounter.   PROCEDURES: No procedures today   Fuller Canada, MD  07/06/2023 10:34 AM

## 2023-07-13 ENCOUNTER — Other Ambulatory Visit: Payer: Self-pay | Admitting: Orthopedic Surgery

## 2023-08-14 ENCOUNTER — Other Ambulatory Visit: Payer: Self-pay | Admitting: Orthopedic Surgery

## 2023-08-14 DIAGNOSIS — M170 Bilateral primary osteoarthritis of knee: Secondary | ICD-10-CM

## 2023-09-06 ENCOUNTER — Other Ambulatory Visit: Payer: Self-pay | Admitting: Orthopedic Surgery

## 2023-09-13 ENCOUNTER — Other Ambulatory Visit: Payer: Self-pay | Admitting: Orthopedic Surgery

## 2023-09-13 MED ORDER — TRAMADOL HCL 50 MG PO TABS: 50.0000 mg | ORAL_TABLET | Freq: Four times a day (QID) | ORAL | 0 refills | Status: DC | PRN

## 2023-09-13 NOTE — Telephone Encounter (Signed)
Refill request received via fax for Tramadol

## 2023-10-19 ENCOUNTER — Other Ambulatory Visit: Payer: Self-pay | Admitting: Orthopedic Surgery

## 2023-11-07 ENCOUNTER — Other Ambulatory Visit: Payer: Self-pay | Admitting: Orthopedic Surgery

## 2023-11-07 DIAGNOSIS — M170 Bilateral primary osteoarthritis of knee: Secondary | ICD-10-CM

## 2023-11-21 ENCOUNTER — Other Ambulatory Visit: Payer: Self-pay | Admitting: Orthopedic Surgery

## 2023-12-19 ENCOUNTER — Other Ambulatory Visit: Payer: Self-pay | Admitting: Orthopedic Surgery

## 2023-12-20 ENCOUNTER — Other Ambulatory Visit: Payer: Self-pay | Admitting: Orthopedic Surgery

## 2023-12-20 DIAGNOSIS — M170 Bilateral primary osteoarthritis of knee: Secondary | ICD-10-CM

## 2023-12-20 NOTE — Telephone Encounter (Signed)
 Refill request received via fax for  Diclofenac   [facility]

## 2023-12-21 MED ORDER — DICLOFENAC SODIUM 75 MG PO TBEC
75.0000 mg | DELAYED_RELEASE_TABLET | Freq: Two times a day (BID) | ORAL | 5 refills | Status: DC
Start: 2023-12-21 — End: 2024-05-06

## 2024-01-01 ENCOUNTER — Emergency Department (HOSPITAL_COMMUNITY)

## 2024-01-01 ENCOUNTER — Other Ambulatory Visit: Payer: Self-pay

## 2024-01-01 ENCOUNTER — Encounter (HOSPITAL_COMMUNITY): Payer: Self-pay | Admitting: Emergency Medicine

## 2024-01-01 ENCOUNTER — Emergency Department (HOSPITAL_COMMUNITY)
Admission: EM | Admit: 2024-01-01 | Discharge: 2024-01-01 | Disposition: A | Discharge status: Home / Self Care | Attending: Emergency Medicine | Admitting: Emergency Medicine

## 2024-01-01 DIAGNOSIS — Z85820 Personal history of malignant melanoma of skin: Secondary | ICD-10-CM | POA: Insufficient documentation

## 2024-01-01 DIAGNOSIS — S62639B Displaced fracture of distal phalanx of unspecified finger, initial encounter for open fracture: Secondary | ICD-10-CM

## 2024-01-01 DIAGNOSIS — S61314A Laceration without foreign body of right ring finger with damage to nail, initial encounter: Secondary | ICD-10-CM | POA: Diagnosis not present

## 2024-01-01 DIAGNOSIS — W230XXA Caught, crushed, jammed, or pinched between moving objects, initial encounter: Secondary | ICD-10-CM | POA: Insufficient documentation

## 2024-01-01 DIAGNOSIS — S6991XA Unspecified injury of right wrist, hand and finger(s), initial encounter: Secondary | ICD-10-CM | POA: Diagnosis present

## 2024-01-01 DIAGNOSIS — S62634B Displaced fracture of distal phalanx of right ring finger, initial encounter for open fracture: Secondary | ICD-10-CM | POA: Diagnosis not present

## 2024-01-01 MED ORDER — ACETAMINOPHEN 325 MG PO TABS: 650.0000 mg | ORAL_TABLET | Freq: Four times a day (QID) | ORAL | 0 refills | Status: AC | PRN

## 2024-01-01 MED ORDER — TRAMADOL HCL 50 MG PO TABS: 50.0000 mg | ORAL_TABLET | Freq: Four times a day (QID) | ORAL | 0 refills | Status: AC | PRN

## 2024-01-01 MED ORDER — BACITRACIN ZINC 500 UNIT/GM EX OINT
TOPICAL_OINTMENT | CUTANEOUS | Status: AC
  Filled 2024-01-01: qty 0.9

## 2024-01-01 MED ORDER — CEFADROXIL 500 MG PO CAPS: 500.0000 mg | ORAL_CAPSULE | Freq: Two times a day (BID) | ORAL | 0 refills | Status: AC

## 2024-01-01 MED ORDER — TETANUS-DIPHTH-ACELL PERTUSSIS 5-2.5-18.5 LF-MCG/0.5 IM SUSY: 0.5000 mL | PREFILLED_SYRINGE | Freq: Once | INTRAMUSCULAR | Status: DC

## 2024-01-01 MED ORDER — BACITRACIN ZINC 500 UNIT/GM EX OINT: TOPICAL_OINTMENT | Freq: Once | CUTANEOUS | Status: AC

## 2024-01-01 MED ORDER — LIDOCAINE-EPINEPHRINE (PF) 2 %-1:200000 IJ SOLN
10.0000 mL | Freq: Once | INTRAMUSCULAR | Status: AC
  Administered 2024-01-01: 10 mL
  Filled 2024-01-01: qty 20

## 2024-01-01 MED ORDER — CEFAZOLIN SODIUM 1 G IJ SOLR
2.0000 g | Freq: Once | INTRAMUSCULAR | Status: AC
  Administered 2024-01-01: 2 g via INTRAMUSCULAR
  Filled 2024-01-01: qty 20

## 2024-01-01 NOTE — ED Provider Notes (Signed)
 [place] EMERGENCY DEPARTMENT AT [facility] Provider Note  CSN: [id number] Arrival date & time: 01/01/24 7829  Chief Complaint(s) Laceration  HPI Grace Price is a 63 y.o. female with past medical history as below, significant for seborrheic keratosis, melanoma who presents to the ED with complaint of finger injury  Right 4th digit injured on door  Finger got stuck in the door while caring for pet, tried to pull finger from door and fingertip was stuck in door jam RHD Tetanus is UTD No other injuries   Past Medical History History reviewed. No pertinent past medical history. Patient Active Problem List   Diagnosis Date Noted   Lipoma of back 09/09/2021   Seborrheic keratosis 09/09/2021   Melanoma in situ of shoulder (HCC) 09/09/2021   Home Medication(s) Prior to Admission medications   Medication Sig Start Date End Date Taking? Authorizing Provider  acetaminophen (TYLENOL) 325 MG tablet Take 2 tablets (650 mg total) by mouth every 6 (six) hours as needed. 01/01/24  Yes Russella Courts A, DO  cefadroxil (DURICEF) 500 MG capsule Take 1 capsule (500 mg total) by mouth 2 (two) times daily for 7 days. 01/01/24 01/08/24 Yes Russella Courts A, DO  traMADol  (ULTRAM ) 50 MG tablet Take 1 tablet (50 mg total) by mouth every 6 (six) hours as needed. 01/01/24  Yes Russella Courts A, DO  Calcium Carbonate (CALCIUM 500 PO) Take 500 mg by mouth daily.    [provider]  Cetirizine HCl (ZYRTEC PO) Take by mouth.    [provider]  Cholecalciferol 125 MCG (5000 UT) TABS Take 5,000 Units by mouth daily.    [provider]  diclofenac  (VOLTAREN ) 75 MG EC tablet Take 1 tablet (75 mg total) by mouth 2 (two) times daily. 12/21/23   Darrin Emerald, MD  Multiple Vitamin (MULTIVITAMIN) tablet Take 1 tablet by mouth daily.    [provider]  pantoprazole (PROTONIX) 40 MG tablet Take 40 mg by mouth daily. 01/25/21   [provider]  sertraline (ZOLOFT) 100  MG tablet Take 100 mg by mouth daily. 12/23/20   [provider]  traMADol  (ULTRAM ) 50 MG tablet Take 1 tablet (50 mg total) by mouth every 6 (six) hours as needed. 12/20/23   Darrin Emerald, MD                                                                                                                                    Past Surgical History History reviewed. No pertinent surgical history. Family History History reviewed. No pertinent family history.  Social History Social History   Tobacco Use   Smoking status: Never   Smokeless tobacco: Never  Vaping Use   Vaping status: Never Used  Substance Use Topics   Alcohol use: Yes   Drug use: No   Allergies Fish allergy, Penicillins, Strawberry extract, and Sulfa antibiotics  Review of Systems A thorough review  of systems was obtained and all systems are negative except as noted in the HPI and PMH.   Physical Exam Vital Signs  I have reviewed the triage vital signs BP (!) 172/80   Pulse 67   Temp 98.2 F (36.8 C) (Oral)   Resp 18   Ht 5\' 5"  (1.651 m)   Wt 68 kg   SpO2 95%   BMI 24.96 kg/m  Physical Exam Vitals and nursing note reviewed.  Constitutional:      General: She is not in acute distress.    Appearance: Normal appearance. She is well-developed. She is not ill-appearing.  HENT:     Head: Normocephalic and atraumatic.     Right Ear: External ear normal.     Left Ear: External ear normal.     Nose: Nose normal.     Mouth/Throat:     Mouth: Mucous membranes are moist.  Eyes:     General: No scleral icterus.       Right eye: No discharge.        Left eye: No discharge.  Cardiovascular:     Rate and Rhythm: Normal rate.  Pulmonary:     Effort: Pulmonary effort is normal. No respiratory distress.     Breath sounds: No stridor.  Abdominal:     General: Abdomen is flat. There is no distension.     Tenderness: There is no guarding.  Musculoskeletal:        General: No deformity.     Cervical  back: No rigidity.     Comments: Near amputation of right 4th digit, capillary refill is delayed but still present   Skin:    General: Skin is warm and dry.     Coloration: Skin is not cyanotic, jaundiced or pale.  Neurological:     Mental Status: She is alert and oriented to person, place, and time.     GCS: GCS eye subscore is 4. GCS verbal subscore is 5. GCS motor subscore is 6.  Psychiatric:        Speech: Speech normal.        Behavior: Behavior normal. Behavior is cooperative.     ED Results and Treatments Labs (all labs ordered are listed, but only abnormal results are displayed) Labs Reviewed - No data to display                                                                                                                        Radiology DG Hand 2 View Right Result Date: 01/01/2024 CLINICAL DATA:  Ring finger injury. EXAM: RIGHT HAND - 2 VIEW COMPARISON:  None Available. FINDINGS: Extensive laceration noted distal ring finger with associated fracture through the tuft of the distal phalanx. No unexpected radiopaque soft tissue foreign body. IMPRESSION: Extensive laceration distal ring finger with associated fracture through the tuft of the distal phalanx. Electronically Signed   By: Donnal Fusi M.D.   On: 01/01/2024 08:16    Pertinent labs & imaging  results that were available during my care of the patient were reviewed by me and considered in my medical decision making (see MDM for details).  Medications Ordered in ED Medications  lidocaine-EPINEPHrine (XYLOCAINE W/EPI) 2 %-1:200000 (PF) injection 10 mL (10 mLs Other Given by Other 01/01/24 0914)  ceFAZolin (ANCEF) injection 2 g (2 g Intramuscular Given 01/01/24 0909)                                                                                                                                     Procedures .Laceration Repair  Date/Time: 01/01/2024 9:59 AM  Performed by: Teddi Favors, DO Authorized by: Teddi Favors, DO    Consent:    Consent obtained:  Verbal   Consent given by:  Patient   Risks, benefits, and alternatives were discussed: yes     Risks discussed:  Infection, pain and poor cosmetic result Universal protocol:    Procedure explained and questions answered to patient or proxy's satisfaction: yes     Immediately prior to procedure, a time out was called: yes     Patient identity confirmed:  Verbally with patient and arm band Anesthesia:    Anesthesia method:  Local infiltration   Local anesthetic:  Lidocaine 2% WITH epi Laceration details:    Location:  Finger   Finger location:  R ring finger   Length (cm):  3   Depth (mm):  4 Pre-procedure details:    Preparation:  Patient was prepped and draped in usual sterile fashion and imaging obtained to evaluate for foreign bodies Exploration:    Limited defect created (wound extended): no     Hemostasis achieved with:  Direct pressure   Imaging obtained: x-ray     Imaging outcome: foreign body not noted     Wound exploration: wound explored through full range of motion and entire depth of wound visualized   Treatment:    Area cleansed with:  Saline   Amount of cleaning:  Extensive   Irrigation method:  Syringe Skin repair:    Repair method:  Sutures   Suture size:  3-0   Suture material:  Nylon   Suture technique:  Simple interrupted   Number of sutures:  4 Approximation:    Approximation:  Loose Repair type:    Repair type:  Simple Post-procedure details:    Dressing:  Antibiotic ointment and non-adherent dressing   Procedure completion:  Tolerated well, no immediate complications   (including critical care time)  Medical Decision Making / ED Course    Medical Decision Making:    SUKAINA TOOTHAKER is a 63 y.o. female  with past medical history as below, significant for seborrheic keratosis, melanoma who presents to the ED with complaint of finger injury. The complaint involves an extensive differential diagnosis and also  carries with it a high risk of complications and morbidity.  Serious etiology was considered. Ddx includes but is  not limited to: amputation, fracture, laceration, fb, etc  Complete initial physical exam performed, notably the patient was in mild distress 2/2 pain, HDS.    Reviewed and confirmed nursing documentation for past medical history, family history, social history.  Vital signs reviewed.     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 Detailed discussions were had with the patient/guardian regarding current findings, and need for close f/u with PCP or on call doctor. The patient/guardian has been instructed to return immediately if the symptoms worsen in any way for re-evaluation. Patient/guardian verbalized understanding and is in agreement with current care plan. All questions answered prior to discharge.              Additional history obtained: -Additional history obtained from na -External records from outside source obtained and reviewed including: Chart review including previous notes, labs, imaging, consultation notes including  Primary care documentation   Lab Tests: na  EKG   EKG Interpretation Date/Time:    Ventricular Rate:    PR Interval:    QRS Duration:    QT Interval:    QTC Calculation:   R Axis:      Text Interpretation:           Imaging Studies ordered: I ordered imaging studies  including hand xr I independently visualized the following imaging with scope of interpretation limited to determining acute life threatening conditions related to emergency care; findings noted above I agree with the radiologist interpretation If any imaging was obtained with contrast I closely monitored patient for any possible adverse reaction a/w contrast administration in the emergency department   Medicines ordered and prescription drug management: Meds ordered this encounter  Medications   lidocaine-EPINEPHrine (XYLOCAINE W/EPI) 2 %-1:200000 (PF) injection 10 mL   DISCONTD: Tdap (BOOSTRIX) injection 0.5 mL   ceFAZolin (ANCEF) injection 2 g   traMADol  (ULTRAM ) 50 MG tablet    Sig: Take 1 tablet (50 mg total) by mouth every 6 (six) hours as needed.    Dispense:  15 tablet    Refill:  0   acetaminophen (TYLENOL) 325 MG tablet    Sig: Take 2 tablets (650 mg total) by mouth every 6 (six) hours as needed.    Dispense:  36 tablet    Refill:  0   cefadroxil (DURICEF) 500 MG capsule    Sig: Take 1 capsule (500 mg total) by mouth 2 (two) times daily for 7 days.    Dispense:  14 capsule    Refill:  0    -I have reviewed the patients home medicines and have made adjustments as needed   Consultations Obtained: na   Cardiac Monitoring: Continuous pulse oximetry interpreted by myself, 97% on RA.    Social Determinants of Health:  Diagnosis or treatment significantly limited by social determinants of health: lives alone   Reevaluation: After the interventions noted above, I reevaluated the patient and found that they  have improved  Co morbidities that complicate the patient evaluation History reviewed. No pertinent past medical history.    Dispostion: Disposition decision including need for hospitalization was considered, and patient discharged from emergency department.    Final Clinical Impression(s) / ED Diagnoses Final diagnoses:  Open fracture of tuft of distal  phalanx of finger  Laceration of right ring finger without foreign body with damage to nail, initial encounter        Teddi Favors, DO 01/01/24 1008

## 2024-01-01 NOTE — ED Triage Notes (Signed)
 Pt reports her right ring finger was closed in a door causing a laceration with the "tip barely hanging on", bleeding controlled

## 2024-01-01 NOTE — Discharge Instructions (Addendum)
 Keep the wound clean and as dry as possible. Do not immerse or soak the wound in water. This means no swimming, washing dishes (unless thick rubber gloves are used), baths, or hot tubs until the stitches are removed or after about two weeks if absorbable suture material was used. Leave original bandages on the wound for the first 12-24 hours. After this time, showering or rinsing is recommended, rather than bathing. the first day, remove old bandages and gently cleanse the wound with soap and water. Cleansing twice a day prevents buildup of debris and will result in easier suture removal.   Please call Dr Delfino Fellers office later today to arrange for follow up >your finger tip may not survive given the traumatic nature of the injury. Please have Dr Phyllis Breeze see in the office. If you notice discoloration to the finger, pus drainage, severe pain, or any worsening/worrisome symptoms please come back to the ER

## 2024-01-01 NOTE — ED Notes (Signed)
 Wound care performed, finger lac wrapped with non-adherent xeroform dressing and gauze. Affected finger splinted and pt educated on care.

## 2024-01-29 ENCOUNTER — Other Ambulatory Visit: Payer: Self-pay | Admitting: Orthopedic Surgery

## 2024-02-12 ENCOUNTER — Other Ambulatory Visit: Payer: Self-pay | Admitting: Orthopedic Surgery

## 2024-03-27 ENCOUNTER — Other Ambulatory Visit: Payer: Self-pay | Admitting: Orthopedic Surgery

## 2024-03-27 DIAGNOSIS — M170 Bilateral primary osteoarthritis of knee: Secondary | ICD-10-CM

## 2024-04-29 ENCOUNTER — Other Ambulatory Visit: Payer: Self-pay | Admitting: Orthopedic Surgery

## 2024-04-29 DIAGNOSIS — M170 Bilateral primary osteoarthritis of knee: Secondary | ICD-10-CM

## 2024-05-06 ENCOUNTER — Other Ambulatory Visit: Payer: Self-pay | Admitting: Orthopedic Surgery

## 2024-05-06 DIAGNOSIS — M170 Bilateral primary osteoarthritis of knee: Secondary | ICD-10-CM

## 2024-06-03 ENCOUNTER — Other Ambulatory Visit: Payer: Self-pay | Admitting: Orthopedic Surgery

## 2024-06-03 DIAGNOSIS — M170 Bilateral primary osteoarthritis of knee: Secondary | ICD-10-CM

## 2024-07-03 ENCOUNTER — Other Ambulatory Visit: Payer: Self-pay | Admitting: Orthopedic Surgery

## 2024-07-03 DIAGNOSIS — M170 Bilateral primary osteoarthritis of knee: Secondary | ICD-10-CM

## 2024-07-05 ENCOUNTER — Encounter: Payer: Self-pay | Admitting: Orthopedic Surgery

## 2024-07-05 ENCOUNTER — Other Ambulatory Visit: Payer: Self-pay

## 2024-07-05 ENCOUNTER — Ambulatory Visit: Admitting: Orthopedic Surgery

## 2024-07-05 ENCOUNTER — Other Ambulatory Visit

## 2024-07-05 DIAGNOSIS — M170 Bilateral primary osteoarthritis of knee: Secondary | ICD-10-CM

## 2024-07-05 DIAGNOSIS — M25561 Pain in right knee: Secondary | ICD-10-CM

## 2024-07-05 DIAGNOSIS — M25562 Pain in left knee: Secondary | ICD-10-CM

## 2024-07-05 DIAGNOSIS — G8929 Other chronic pain: Secondary | ICD-10-CM

## 2024-07-05 MED ORDER — METAXALONE 800 MG PO TABS: 800.0000 mg | ORAL_TABLET | Freq: Three times a day (TID) | ORAL | 5 refills | Status: AC

## 2024-07-05 NOTE — Progress Notes (Signed)
 Patient: Grace Price           Date of Birth: Jul 29, 1960           MRN: [id number] Visit Date: 07/05/2024 Requested by: Alphonsa Glendia LABOR, MD [address] [place],  [place] [address] PCP: Alphonsa Glendia LABOR, MD  Encounter Diagnoses  Name Primary?   Chronic pain of both knees    Bilateral primary osteoarthritis of knee Yes    Assessment and plan:  63 year old female with severe arthritis of her right knee and moderate to severe arthritis of her left knee needs a left total knee arthroplasty  She says that her company [facility] only allows patients to have surgery at certain places.  We are not in that group  She needs a stemmed component with medial augmentation and a fairly large release to get this knee in a functional condition  She cannot do it now.  I encouraged her to do it as soon as she can   She requested metaxalone to help with her muscle spasms Meds ordered this encounter  Medications   metaxalone (SKELAXIN) 800 MG tablet    Sig: Take 1 tablet (800 mg total) by mouth 3 (three) times daily.    Dispense:  60 tablet    Refill:  5     Chief Complaint  Patient presents with   Knee Pain    Left/ right one has been painful also, having hard time ambulating due to pain  *walks with knees flexed    History:  63 year old female last seen about 2 years ago with bilateral knee pain presents with left greater than right knee stiffness and difficulty ambulating.  She complains of frequent muscle spasms.  She says she does not have a lot of pain she does use a cane currently on tramadol  for pain and she has taken that for years.  Also taking diclofenac  75 mg twice daily  Physical exam Focused exam findings:  Right knee  S skin normal T medial tenderness R flexion contracture 10 flexion up to 95 I none M normal   Left knee  S normal T medial tenderness R 30 degree flexion contracture flexion up to 90 degrees I none M normal   DG Knee AP/LAT  W/Sunrise Right Result Date: 07/05/2024 Bilateral knee pain bilateral knee x-ray starting with the left knee.  The left knee is completely obliterated in terms of the medial compartment there is subluxation of the joint and a large varus deformity.  There is significant loss of the medial tibial bone The patellofemoral joint shows normal alignment no tilt or subluxation As far as the right knee goes medial and lateral joint space narrowing, this knee is still in 3 to 4 degrees of valgus alignment however the joint space narrowing is significant, the bone is still intact with no bone loss as we see on the left side Severe grade 4 arthritis on the left grade 3 arthritis on the right   DG Knee AP/LAT W/Sunrise Left Result Date: 07/05/2024 Bilateral knee pain bilateral knee x-ray starting with the left knee.  The left knee is completely obliterated in terms of the medial compartment there is subluxation of the joint and a large varus deformity.  There is significant loss of the medial tibial bone The patellofemoral joint shows normal alignment no tilt or subluxation As far as the right knee goes medial and lateral joint space narrowing, this knee is still in 3 to 4 degrees of valgus alignment however  the joint space narrowing is significant, the bone is still intact with no bone loss as we see on the left side Severe grade 4 arthritis on the left grade 3 arthritis on the right

## 2024-07-05 NOTE — Progress Notes (Signed)
° ° °  07/05/2024   Chief Complaint  Patient presents with   Knee Pain    Left knee pain/ right knee has been painful also, having hard time ambulating due to pain     No diagnosis found.  What pharmacy do you use ? _____[facility]______________________  DOI/DOS/ Date: ongoing  Did you get better, worse or no change (Answer below)   Worse,  left worse today

## 2024-07-29 ENCOUNTER — Other Ambulatory Visit: Payer: Self-pay | Admitting: Orthopedic Surgery

## 2024-07-29 DIAGNOSIS — M170 Bilateral primary osteoarthritis of knee: Secondary | ICD-10-CM

## 2024-07-30 ENCOUNTER — Telehealth: Payer: Self-pay | Admitting: Radiology

## 2024-07-30 NOTE — Telephone Encounter (Signed)
 Completed a prior auth for tramadol  on cover my meds

## 2024-07-31 ENCOUNTER — Telehealth: Payer: Self-pay | Admitting: Orthopedic Surgery

## 2024-07-31 NOTE — Telephone Encounter (Signed)
 Please copy xrays from 07/05/24 to CD. I need to send [employer] to another provider [facility]). Please let me know when ready.  Thank you so much!!

## 2024-08-01 NOTE — Telephone Encounter (Signed)
CD is ready

## 2024-08-12 ENCOUNTER — Telehealth: Payer: Self-pay | Admitting: Orthopedic Surgery

## 2024-08-12 NOTE — Telephone Encounter (Signed)
 Received a second request from [facility]. I had originally faxed to them on 08/02/24 that the xray CD is ready at our main office location at [address] [place], [place]  [address]. Advised they need to schedule a pickup with [employer]. I re-faxed today advising that the CD has been ready since 01/09 and they need to schedule a pickup at our location.

## 2024-08-19 ENCOUNTER — Other Ambulatory Visit: Payer: Self-pay | Admitting: Orthopedic Surgery

## 2024-08-19 DIAGNOSIS — M170 Bilateral primary osteoarthritis of knee: Secondary | ICD-10-CM

## 2024-09-12 ENCOUNTER — Ambulatory Visit (HOSPITAL_BASED_OUTPATIENT_CLINIC_OR_DEPARTMENT_OTHER)
